# Patient Record
Sex: Male | Born: 2000 | Race: Black or African American | Hispanic: No | Marital: Single | Smoking: Never smoker
Health system: Southern US, Community
[De-identification: ages and names within clinical notes are randomized; demographics above are authoritative.]

---
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
## 2020-11-11 ENCOUNTER — Emergency Department
Admission: EM | Admit: 2020-11-11 | Discharge: 2020-11-11 | Disposition: A | Discharge status: Home / Self Care | Payer: Medicaid Other | Attending: Emergency Medicine | Admitting: Emergency Medicine

## 2020-11-11 ENCOUNTER — Emergency Department: Payer: Medicaid Other

## 2020-11-11 ENCOUNTER — Encounter: Payer: Self-pay | Admitting: Intensive Care

## 2020-11-11 ENCOUNTER — Other Ambulatory Visit: Payer: Self-pay

## 2020-11-11 DIAGNOSIS — S39012A Strain of muscle, fascia and tendon of lower back, initial encounter: Secondary | ICD-10-CM | POA: Diagnosis not present

## 2020-11-11 DIAGNOSIS — Y92410 Unspecified street and highway as the place of occurrence of the external cause: Secondary | ICD-10-CM | POA: Diagnosis not present

## 2020-11-11 DIAGNOSIS — S34109A Unspecified injury to unspecified level of lumbar spinal cord, initial encounter: Secondary | ICD-10-CM | POA: Diagnosis present

## 2020-11-11 MED ORDER — MELOXICAM 15 MG PO TABS: 15.0000 mg | ORAL_TABLET | Freq: Every day | ORAL | 0 refills | Status: AC

## 2020-11-11 MED ORDER — METHOCARBAMOL 500 MG PO TABS: 500.0000 mg | ORAL_TABLET | Freq: Four times a day (QID) | ORAL | 0 refills | Status: AC
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
## 2020-11-14 ENCOUNTER — Telehealth: Payer: Self-pay

## 2020-11-14 NOTE — Telephone Encounter (Signed)
Transition Care Management Follow-up Telephone Call  Date of discharge and from where: 11/11/2020 from [facility]  How have you been since you were released from the hospital? Pt stated that he is feeling better today. Pain is manageable. Pt stated that he does not have a vehicle now to get his rx's to the pharmacy (they were printed). Pt is calling the hospital to see if they are able to send them electronically.   Any questions or concerns? No  Items Reviewed:  Did the pt receive and understand the discharge instructions provided? Yes   Medications obtained and verified? Yes   Other? No   Any new allergies since your discharge? No   Dietary orders reviewed? n/a  Do you have support at home? Yes   Functional Questionnaire: (I = Independent and D = Dependent) ADLs: I  Bathing/Dressing- I  Meal Prep- I  Eating- I  Maintaining continence- I  Transferring/Ambulation- I  Managing Meds- I  Follow up appointments reviewed:   PCP Hospital f/u appt confirmed? No    Specialist Hospital f/u appt confirmed? No  .  Are transportation arrangements needed? No   If their condition worsens, is the pt aware to call PCP or go to the Emergency Dept.? Yes  Was the patient provided with contact information for the PCP's office or ED? Yes  Was to pt encouraged to call back with questions or concerns? Yes

## 2021-05-15 ENCOUNTER — Ambulatory Visit: Payer: Medicaid Other | Admitting: Internal Medicine

## 2021-10-27 ENCOUNTER — Encounter: Payer: Self-pay | Admitting: Family Medicine

## 2021-12-06 ENCOUNTER — Ambulatory Visit: Payer: Medicaid Other | Admitting: Family Medicine

## 2021-12-11 ENCOUNTER — Ambulatory Visit: Payer: Medicaid Other | Admitting: Family Medicine

## 2022-05-03 ENCOUNTER — Other Ambulatory Visit: Payer: Self-pay

## 2022-05-03 ENCOUNTER — Emergency Department: Payer: Self-pay

## 2022-05-03 ENCOUNTER — Emergency Department
Admission: EM | Admit: 2022-05-03 | Discharge: 2022-05-04 | Discharge status: Against Medical Advice | Payer: Self-pay | Attending: Emergency Medicine | Admitting: Emergency Medicine

## 2022-05-03 DIAGNOSIS — R202 Paresthesia of skin: Secondary | ICD-10-CM | POA: Insufficient documentation

## 2022-05-03 DIAGNOSIS — R42 Dizziness and giddiness: Secondary | ICD-10-CM | POA: Insufficient documentation

## 2022-05-03 DIAGNOSIS — R200 Anesthesia of skin: Secondary | ICD-10-CM | POA: Insufficient documentation

## 2022-05-03 DIAGNOSIS — Z5321 Procedure and treatment not carried out due to patient leaving prior to being seen by health care provider: Secondary | ICD-10-CM | POA: Insufficient documentation

## 2022-05-03 DIAGNOSIS — R079 Chest pain, unspecified: Secondary | ICD-10-CM | POA: Insufficient documentation

## 2022-05-03 LAB — CBC
HCT: 44.1 % (ref 39.0–52.0)
Hemoglobin: 14.5 g/dL (ref 13.0–17.0)
MCH: 28 pg (ref 26.0–34.0)
MCHC: 32.9 g/dL (ref 30.0–36.0)
MCV: 85.1 fL (ref 80.0–100.0)
Platelets: 232 10*3/uL (ref 150–400)
RBC: 5.18 MIL/uL (ref 4.22–5.81)
RDW: 12.1 % (ref 11.5–15.5)
WBC: 9.2 10*3/uL (ref 4.0–10.5)
nRBC: 0 % (ref 0.0–0.2)

## 2022-05-03 LAB — BASIC METABOLIC PANEL
Anion gap: 8 (ref 5–15)
BUN: 14 mg/dL (ref 6–20)
CO2: 26 mmol/L (ref 22–32)
Calcium: 9.8 mg/dL (ref 8.9–10.3)
Chloride: 105 mmol/L (ref 98–111)
Creatinine, Ser: 1.13 mg/dL (ref 0.61–1.24)
GFR, Estimated: >60 mL/min (ref >60)
Glucose, Bld: 105 mg/dL — ABNORMAL HIGH (ref 70–99)
Potassium: 3.3 mmol/L — ABNORMAL LOW (ref 3.5–5.1)
Sodium: 139 mmol/L (ref 135–145)

## 2022-05-03 LAB — TROPONIN I (HIGH SENSITIVITY)
Troponin I (High Sensitivity): 7 ng/L (ref <18)
Troponin I (High Sensitivity): 9 ng/L (ref <18)

## 2022-05-03 NOTE — ED Triage Notes (Addendum)
Pt comes with c/o dizziness that has been going on for over month now. Pt states sometimes it is worse in am. Pt tearful in triage. Pt states no drugs or tobacco use.   Pt states some numbness and tingling in left arm and radiates down. Pt states little pain to left side of chest.

## 2022-05-03 NOTE — ED Triage Notes (Signed)
First Nurse Note:  C/o intermittent dizziness x 1 month.  AAOx3.  Skin warm and dry. NAD.  Ambulates with easy and steady gait.

## 2022-05-03 NOTE — ED Notes (Signed)
EKG showed STEMI alert twice on screen. EDT Mel took to [facility] who stated it was not a STEMI. This RN had already started IV, pt taken to subwait at this time.

## 2022-05-04 NOTE — ED Provider Notes (Signed)
Patient left prior to my evaluation   Nance Pear, MD 05/04/22 0020

## 2022-05-04 NOTE — ED Notes (Signed)
Pt stated he was tired and did not want to wait any longer, agreed to leave AMA

## 2022-05-04 NOTE — ED Notes (Signed)
Pt was very insistent that he would leave AMA, AMA form signed, significant other at bedside told patient that he needed to stay. Pt agreed to stay after signing AMA form. MD notified

## 2022-05-08 ENCOUNTER — Other Ambulatory Visit: Payer: Self-pay

## 2022-05-08 ENCOUNTER — Encounter: Payer: Self-pay | Admitting: Emergency Medicine

## 2022-05-08 ENCOUNTER — Emergency Department
Admission: EM | Admit: 2022-05-08 | Discharge: 2022-05-08 | Disposition: A | Discharge status: Home / Self Care | Payer: Self-pay | Attending: Emergency Medicine | Admitting: Emergency Medicine

## 2022-05-08 DIAGNOSIS — R000 Tachycardia, unspecified: Secondary | ICD-10-CM | POA: Insufficient documentation

## 2022-05-08 NOTE — Discharge Instructions (Addendum)
Please follow-up with primary care.  If you have insurance you can use the [facility] or [facility] or [facility] or [facility] or [facility].  If you do not ,there are number of clinics that can see you for reduced cost including the [facility] or the [facility] or the [facility] or [facility].  Additionally [facility] has a charity care program for people who are residence in state.  These clinics always require some paperwork.  The EKG that we did today looks normal.  The lab work and chest x-ray CT of the head from the last visit also look normal.

## 2022-05-08 NOTE — ED Provider Notes (Signed)
[facility] Provider Note    Event Date/Time   First MD Initiated Contact with Patient 05/08/22 0802     (approximate)   History   Eye Problem and Tachycardia   HPI  Marc Lang is a 21 y.o. male comes in complaining of 2 things.  First is that he was here a couple days ago with a rapid heartbeat and there was a fuss made about his EKG and he got nervous.  He had lab work and a number of things done everything turned out normal.  He is not able to wait to see the doctor though. He came back today.  His initial EKG showed sinus tach.   after he calmsed down the EKG was normal.  Patient also complains of right leg giving him a headache in the heel almost dreamy.  CT done on the second was negative.  He has gone to see the eye doctor since then and got glasses.   Past medical history patient reports is negative. patient denies any drug use.  Physical Exam   Triage Vital Signs: ED Triage Vitals  Enc Vitals Group     BP 05/08/22 0752 (!) 159/90     Pulse Rate 05/08/22 0752 (!) 125     Resp 05/08/22 0752 18     Temp 05/08/22 0752 98.2 F (36.8 C)     Temp Source 05/08/22 0752 Oral     SpO2 05/08/22 0752 100 %     Weight 05/08/22 0753 165 lb (74.8 kg)     Height 05/08/22 0753 6' (1.829 m)     Head Circumference --      Peak Flow --      Pain Score 05/08/22 0753 0     Pain Loc --      Pain Edu? --      Excl. in [place]? --     Most recent vital signs: Vitals:   05/08/22 0752  BP: (!) 159/90  Pulse: (!) 125  Resp: 18  Temp: 98.2 F (36.8 C)  SpO2: 100%     General: Awake, no distress.  Head normocephalic atraumatic Eyes pupils equal round reactive extraocular movements intact fundi somewhat difficult to see but look normal CV:  Good peripheral perfusion.  Heart regular rate and rhythm no audible murmurs Resp:  Normal effort.  Lungs are clear Abd:  No distention.  Soft and nontender Extremities with no edema   ED Results / Procedures /  Treatments   Labs (all labs ordered are listed, but only abnormal results are displayed) Labs Reviewed - No data to display   EKG  EKG #1 read and interpreted by me shows sinus tachycardia rate of 136 normal axis there are flipped T's inferiorly.  When compared to EKG from the second he had ST elevation in 1 and L earlier.  This is essentially gone now.  Patient did not have any pain with this EKG.  EKG #2 when he is not scared shows sinus rhythm at 93 normal axis resolution of all the EKG changes.  On the monitor patient's heart rate was in the 70s often.   RADIOLOGY    PROCEDURES:  Critical Care performed:   Procedures   MEDICATIONS ORDERED IN ED: Medications - No data to display   IMPRESSION / MDM / [facility] / ED COURSE  I reviewed the triage vital signs and the nursing notes. He had 2 negative troponins last time he did not have any chest  pain or tightness except for he reports momentary sharp stabbing pains on occasion the left chest that lasts a second or 2 the most a minute.  His troponins were negative last time.  He has thorough work-up with head CT chest x-ray and labs which were essentially normal except for minimal potassium.  I do not think he needs to have them repeated today.  He is negative except.  Sharp brief pains are likely muscular.  He is not short of breath with them he does not have any heavy tightness or other prolonged more than chest pain.  His EKG normalized his rate came down.  Have advised him to get a primary care doctor.  Patient's presentation is most consistent with acute, uncomplicated illness.  The patient is on the cardiac monitor to evaluate for evidence of arrhythmia and/or significant heart rate changes none of which were seen    FINAL CLINICAL IMPRESSION(S) / ED DIAGNOSES   Final diagnoses:  Sinus tachycardia     Rx / DC Orders   ED Discharge Orders     None        Note:  This document was prepared using  Dragon voice recognition software and may include unintentional dictation errors.   Arnaldo Natal, MD 05/08/22 [phone]

## 2022-05-08 NOTE — ED Notes (Signed)
First Nurse Note: Pt to ED via POV stating that he was seen last week for high heart rate. Pt would like his heart rate rechecked and would also liked to have his eyes checked.

## 2022-05-08 NOTE — ED Triage Notes (Signed)
Pt to ER with c/o sensitivity to light and lightheadedness.  States was seen by eye doctor recently and told all was fine.  Was seen here several days ago for the dizziness and had a complete workup and told everything checked out fine.  Pt states continues to have sensitivity to the light causing lightheadedness.  Also states was told his heart rate was high when he was here before and wants to get that rechecked.  Pt denies new symptoms.
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
